# Patient Record
Sex: Male | Born: 1955 | Race: White | Hispanic: No | Marital: Married | Smoking: Never smoker
Health system: Southern US, Community
[De-identification: ages and names within clinical notes are randomized; demographics above are authoritative.]

## PROBLEM LIST (undated history)

## (undated) DIAGNOSIS — F32A Depression, unspecified: Secondary | ICD-10-CM

## (undated) DIAGNOSIS — M109 Gout, unspecified: Secondary | ICD-10-CM

## (undated) DIAGNOSIS — C801 Malignant (primary) neoplasm, unspecified: Secondary | ICD-10-CM

## (undated) DIAGNOSIS — I10 Essential (primary) hypertension: Secondary | ICD-10-CM

## (undated) DIAGNOSIS — F419 Anxiety disorder, unspecified: Secondary | ICD-10-CM

## (undated) DIAGNOSIS — F329 Major depressive disorder, single episode, unspecified: Secondary | ICD-10-CM

## (undated) HISTORY — DX: Gout, unspecified: M10.9

## (undated) HISTORY — DX: Depression, unspecified: F32.A

## (undated) HISTORY — DX: Anxiety disorder, unspecified: F41.9

## (undated) HISTORY — PX: INSERTION PROSTATE RADIATION SEED: SUR718

## (undated) HISTORY — DX: Essential (primary) hypertension: I10

## (undated) HISTORY — PX: COLONOSCOPY: SHX174

## (undated) HISTORY — DX: Major depressive disorder, single episode, unspecified: F32.9

---

## 2009-06-25 ENCOUNTER — Emergency Department (HOSPITAL_COMMUNITY): Admission: EM | Admit: 2009-06-25 | Discharge: 2009-06-25 | Payer: Self-pay | Admitting: Emergency Medicine
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
## 2016-08-01 ENCOUNTER — Encounter: Payer: Self-pay | Admitting: Urology

## 2016-08-01 ENCOUNTER — Ambulatory Visit (INDEPENDENT_AMBULATORY_CARE_PROVIDER_SITE_OTHER): Payer: BLUE CROSS/BLUE SHIELD | Admitting: Urology

## 2016-08-01 VITALS — BP 133/80 | HR 69 | Ht 69.5 in | Wt 194.5 lb

## 2016-08-01 DIAGNOSIS — N402 Nodular prostate without lower urinary tract symptoms: Secondary | ICD-10-CM | POA: Diagnosis not present

## 2016-08-01 DIAGNOSIS — N138 Other obstructive and reflux uropathy: Secondary | ICD-10-CM

## 2016-08-01 DIAGNOSIS — N401 Enlarged prostate with lower urinary tract symptoms: Secondary | ICD-10-CM | POA: Diagnosis not present

## 2016-08-01 DIAGNOSIS — N529 Male erectile dysfunction, unspecified: Secondary | ICD-10-CM

## 2016-08-01 NOTE — Progress Notes (Addendum)
08/01/2016 12:15 PM   Earl Pratt 1956/07/09 [id number]  Referring provider: Renee Rival, NP [address] [place], [place] [id number]  Chief Complaint  Patient presents with  . Erectile Dysfunction    New patient    HPI: Patient is a 61 year old Caucasian male who is referred by Renee Rival, NP for erectile dysfunction.  Erectile dysfunction His SHIM score is 5, which is severe ED.   He has been having difficulty with erections for the last five years.   His major complaint is lack of firmness for penetration.  His libido is preserved.  His risk factors for ED are age, BPH, HTN and blood pressure medications.   He denies any painful erections or curvatures with his erections.   He has tried Viagra in the past, but he did not like side effects.   He loss his wife in 11/2015.       SHIM    Row Name 08/01/16 1126         SHIM: Over the last 6 months:   How do you rate your confidence that you could get and keep an erection? Very Low     When you had erections with sexual stimulation, how often were your erections hard enough for penetration (entering your partner)? Almost Never or Never     During sexual intercourse, how often were you able to maintain your erection after you had penetrated (entered) your partner? Extremely Difficult     During sexual intercourse, how difficult was it to maintain your erection to completion of intercourse? Extremely Difficult     When you attempted sexual intercourse, how often was it satisfactory for you? Extremely Difficult       SHIM Total Score   SHIM 5        Score: 1-7 Severe ED 8-11 Moderate ED 12-16 Mild-Moderate ED 17-21 Mild ED 22-25 No ED   BPH WITH LUTS His IPSS score today is 7, which is mild lower urinary tract symptomatology. He is mostly satisfied with his quality life due to his urinary symptoms.   His major complaint today is nocturia x 2.  He has had these symptoms for several years.  He denies any  dysuria, hematuria or suprapubic pain.   He also denies any recent fevers, chills, nausea or vomiting.  He does not have a family history of PCa.      IPSS    Row Name 08/01/16 1100         International Prostate Symptom Score   How often have you had the sensation of not emptying your bladder? Less than 1 in 5     How often have you had to urinate less than every two hours? Less than half the time     How often have you found you stopped and started again several times when you urinated? Less than 1 in 5 times     How often have you found it difficult to postpone urination? Not at All     How often have you had a weak urinary stream? Less than 1 in 5 times     How often have you had to strain to start urination? Not at All     How many times did you typically get up at night to urinate? 2 Times     Total IPSS Score 7       Quality of Life due to urinary symptoms   If you were to spend  the rest of your life with your urinary condition just the way it is now how would you feel about that? Mostly Satisfied        Score:  1-7 Mild 8-19 Moderate 20-35 Severe    PMH: Past Medical History:  Diagnosis Date  . Anxiety   . Arthritis   . Depression   . Gout   . Hypertension     Surgical History: History reviewed. No pertinent surgical history.  Home Medications:  Allergies as of 08/01/2016   No Known Allergies     Medication List       Accurate as of 08/01/16 12:15 PM. Always use your most recent med list.          benazepril-hydrochlorthiazide 20-12.5 MG tablet Commonly known as:  LOTENSIN HCT       Allergies: No Known Allergies  Family History: Family History  Problem Relation Age of Onset  . Prostate cancer Neg Hx   . Bladder Cancer Neg Hx   . Kidney cancer Neg Hx     Social History:  reports that he has never smoked. He has never used smokeless tobacco. He reports that he drinks alcohol. He reports that he does not use  drugs.  ROS: UROLOGY Frequent Urination?: No Hard to postpone urination?: No Burning/pain with urination?: No Get up at night to urinate?: No Leakage of urine?: Yes Urine stream starts and stops?: No Trouble starting stream?: No Do you have to strain to urinate?: No Blood in urine?: No Urinary tract infection?: No Sexually transmitted disease?: No Injury to kidneys or bladder?: No Painful intercourse?: No Weak stream?: No Erection problems?: Yes Penile pain?: No  Gastrointestinal Nausea?: No Vomiting?: No Indigestion/heartburn?: No Diarrhea?: No Constipation?: No  Constitutional Fever: No Night sweats?: No Weight loss?: No Fatigue?: No  Skin Skin rash/lesions?: No Itching?: No  Eyes Blurred vision?: No Double vision?: No  Ears/Nose/Throat Sore throat?: No Sinus problems?: No  Hematologic/Lymphatic Swollen glands?: No Easy bruising?: No  Cardiovascular Leg swelling?: No Chest pain?: No  Respiratory Cough?: No Shortness of breath?: No  Endocrine Excessive thirst?: No  Musculoskeletal Back pain?: No Joint pain?: No  Neurological Headaches?: No Dizziness?: No  Psychologic Depression?: Yes Anxiety?: Yes  Physical Exam: BP 133/80 (BP Location: Left Arm, Patient Position: Sitting, Cuff Size: Normal)   Pulse 69   Ht 5' 9.5" (1.765 m)   Wt 194 lb 8 oz (88.2 kg)   BMI 28.31 kg/m   Constitutional: Well nourished. Alert and oriented, No acute distress. HEENT: [place] AT, moist mucus membranes. Trachea midline, no masses. Cardiovascular: No clubbing, cyanosis, or edema. Respiratory: Normal respiratory effort, no increased work of breathing. GI: Abdomen is soft, non tender, non distended, no abdominal masses. Liver and spleen not palpable.  No hernias appreciated.  Stool sample for occult testing is not indicated.   GU: No CVA tenderness.  No bladder fullness or masses.  Patient with circumcised phallus.  Urethral meatus is patent.  No penile  discharge. No penile lesions or rashes. Scrotum without lesions, cysts, rashes and/or edema.  Testicles are located scrotally bilaterally. No masses are appreciated in the testicles. Left and right epididymis are normal. Rectal: Patient with  normal sphincter tone. Anus and perineum without scarring or rashes. No rectal masses are appreciated. Prostate is approximately 55 grams, 10 mm x 10 mm nodules is appreciated in the mid left lobe.  Seminal vesicles are normal. Skin: No rashes, bruises or suspicious lesions. Lymph: No cervical or inguinal adenopathy. Neurologic:  Grossly intact, no focal deficits, moving all 4 extremities. Psychiatric: Normal mood and affect.  Laboratory Data: PSA History  4.0 ng/mL on 06/08/2016   % Free PSA 12.3   0.49 PSA, Free   Assessment & Plan:    1. Erectile dysfunction  - SHIM score is 5  - I explained to the patient that in order to achieve an erection it takes good functioning of the nervous system (parasympathetic, sympathetic, sensory and motor), good blood flow into the erectile tissue of the penis and a desire to have sex  - I explained that conditions like diabetes, hypertension, coronary artery disease, peripheral vascular disease, smoking, alcohol consumption, age, sleep apnea and BPH can diminish the ability to have an erection  - We discussed trying a different PDE5 inhibitor, intra-urethral suppositories, intracavernous vasoactive drug injection therapy, vacuum constriction device and penile prosthesis implantation  - He would like to try Stendra  - RTC pending prostate biopsy results  2. BPH with LUTS  - IPSS score is 7/2  - Continue conservative management, avoiding bladder irritants and timed voiding's  - RTC pending prostate biopsy results  3. Prostate nodule  - discussed with the patient that his most recent free and total PSA noted a 24% probability of having prostate cancer  - repeated PSA today  - Patient will be schedule for a TRUSPBx  of prostate.  The procedure is explained and the risks involved, such as blood in urine, blood in stool, blood in semen, infection, urinary retention, and on rare occasions sepsis and death.  Patient understands the risks as explained to him and he wishes to proceed.     Return for scheule prostate biospy.  These notes generated with voice recognition software. I apologize for typographical errors.  Royden Purl  [facility] [address], [place], [place] [address] [phone]   Addendum:  Patient has provided previous PSA.  2.8 ng/mL on 07/18/2015  0.38 ng/mL PSA, Free  13.6% Free PSA

## 2016-08-02 ENCOUNTER — Telehealth: Payer: Self-pay

## 2016-08-02 LAB — PSA: PROSTATE SPECIFIC AG, SERUM: 4.5 ng/mL — AB (ref 0.0–4.0)

## 2016-08-02 NOTE — Telephone Encounter (Signed)
LMOM

## 2016-08-02 NOTE — Telephone Encounter (Signed)
-----   Message from Nori Riis, PA-C sent at 08/02/2016  7:56 AM EST ----- Patient's PSA is unchanged.  Proceed with biopsy.

## 2016-08-03 NOTE — Telephone Encounter (Signed)
Spoke with pt in reference to PSA results and prostate bx. Pt voiced understanding stating he already has his prep info and appt.

## 2016-08-18 ENCOUNTER — Encounter: Payer: Self-pay | Admitting: Urology

## 2016-08-18 ENCOUNTER — Other Ambulatory Visit: Payer: Self-pay | Admitting: Urology

## 2016-08-18 ENCOUNTER — Ambulatory Visit: Payer: BLUE CROSS/BLUE SHIELD | Admitting: Urology

## 2016-08-18 VITALS — BP 163/92 | HR 67 | Ht 69.0 in | Wt 191.2 lb

## 2016-08-18 DIAGNOSIS — R972 Elevated prostate specific antigen [PSA]: Secondary | ICD-10-CM

## 2016-08-18 DIAGNOSIS — N402 Nodular prostate without lower urinary tract symptoms: Secondary | ICD-10-CM

## 2016-08-18 MED ORDER — LEVOFLOXACIN 500 MG PO TABS
500.0000 mg | ORAL_TABLET | Freq: Once | ORAL | Status: AC
  Administered 2016-08-18: 500 mg via ORAL

## 2016-08-18 MED ORDER — GENTAMICIN SULFATE 40 MG/ML IJ SOLN
80.0000 mg | Freq: Once | INTRAMUSCULAR | Status: AC
  Administered 2016-08-18: 80 mg via INTRAMUSCULAR

## 2016-08-18 NOTE — Progress Notes (Signed)
Prostate Biopsy Procedure   HPI: 61 y.o. male with PSA of 4.0 and prostate nodule noted on exam (1 cm nodule in mid left lobe).   Informed consent was obtained after discussing risks/benefits of the procedure.  A time out was performed to ensure correct patient identity.  Pre-Procedure: - Last PSA Level: 4.0 - Gentamicin given prophylactically - Levaquin 500 mg administered PO -Transrectal Ultrasound performed revealing a 49.68 gm prostate -No significant hypoechoic or median lobe noted  Procedure: - Prostate block performed using 10 cc 1% lidocaine and biopsies taken from sextant areas, a total of 12 under ultrasound guidance.  Post-Procedure: - Patient tolerated the procedure well - He was counseled to seek immediate medical attention if experiences any severe pain, significant bleeding, or fevers - Return in one week to discuss biopsy results

## 2016-08-24 ENCOUNTER — Other Ambulatory Visit: Payer: Self-pay

## 2016-08-25 ENCOUNTER — Ambulatory Visit (INDEPENDENT_AMBULATORY_CARE_PROVIDER_SITE_OTHER): Payer: BLUE CROSS/BLUE SHIELD | Admitting: Urology

## 2016-08-25 ENCOUNTER — Encounter: Payer: Self-pay | Admitting: Urology

## 2016-08-25 ENCOUNTER — Ambulatory Visit: Payer: BLUE CROSS/BLUE SHIELD | Admitting: Urology

## 2016-08-25 ENCOUNTER — Other Ambulatory Visit: Payer: Self-pay | Admitting: Urology

## 2016-08-25 VITALS — BP 164/78 | HR 69 | Ht 69.0 in | Wt 192.0 lb

## 2016-08-25 DIAGNOSIS — C61 Malignant neoplasm of prostate: Secondary | ICD-10-CM | POA: Diagnosis not present

## 2016-08-25 DIAGNOSIS — N5201 Erectile dysfunction due to arterial insufficiency: Secondary | ICD-10-CM | POA: Diagnosis not present

## 2016-08-25 LAB — PATHOLOGY REPORT

## 2016-08-25 NOTE — Progress Notes (Signed)
08/25/2016 3:39 PM   Earl Pratt [birth year] [id number]  Referring provider: Renee Rival, NP [address]O. [place], [place] [address]  No chief complaint on file.   HPI: The patient is a 61 year old gentleman presents today for prostate biopsy results.  1. Prostate cancer The patient had 4 out of 12 cores positive for prostate cancer. 2 of these cores were Gleason 4+3 = 7 prostate cancer at the left base ranging from 75-80%. He had 1 core at the left lateral mid of 1% Gleason 3+4 = 7 prostate cancer. He had 1 core at the right medial mid of Gleason 3+3 = 6 prostate cancer.  PSA diagnosis: 4.5.   DRE: Prostate nodules noted on left lateral lobe  2. Erectile dysfunction Shim: 5 (severe) He has tried Viagra and [place] which were ineffective.   PMH: Past Medical History:  Diagnosis Date  . Anxiety   . Arthritis   . Depression   . Gout   . Hypertension     Surgical History: No past surgical history on file.  Home Medications:  Allergies as of 08/25/2016   No Known Allergies     Medication List       Accurate as of 08/25/16  3:39 PM. Always use your most recent med list.          benazepril-hydrochlorthiazide 20-12.5 MG tablet Commonly known as:  LOTENSIN HCT       Allergies: No Known Allergies  Family History: Family History  Problem Relation Age of Onset  . Prostate cancer Neg Hx   . Bladder Cancer Neg Hx   . Kidney cancer Neg Hx     Social History:  reports that he has never smoked. He has never used smokeless tobacco. He reports that he drinks alcohol. He reports that he does not use drugs.  ROS: UROLOGY Frequent Urination?: No Hard to postpone urination?: No Burning/pain with urination?: No Get up at night to urinate?: Yes Leakage of urine?: No Urine stream starts and stops?: No Trouble starting stream?: No Do you have to strain to urinate?: No Blood in urine?: Yes Urinary tract infection?: No Sexually transmitted disease?:  No Injury to kidneys or bladder?: No Painful intercourse?: No Weak stream?: No Erection problems?: Yes Penile pain?: No  Gastrointestinal Nausea?: No Vomiting?: No Indigestion/heartburn?: No Diarrhea?: No Constipation?: No  Constitutional Fever: No Night sweats?: No Weight loss?: No Fatigue?: No  Skin Skin rash/lesions?: No Itching?: No  Eyes Blurred vision?: No Double vision?: No  Ears/Nose/Throat Sore throat?: No Sinus problems?: No  Hematologic/Lymphatic Swollen glands?: No Easy bruising?: No  Cardiovascular Leg swelling?: No Chest pain?: No  Respiratory Cough?: No Shortness of breath?: No  Endocrine Excessive thirst?: No  Musculoskeletal Back pain?: No Joint pain?: No  Neurological Headaches?: No Dizziness?: No  Psychologic Depression?: No Anxiety?: No  Physical Exam: BP (!) 164/78   Pulse 69   Ht 5\' 9"  (1.753 m)   Wt 192 lb (87.1 kg)   BMI 28.35 kg/m   Constitutional:  Alert and oriented, No acute distress. HEENT: [place] AT, moist mucus membranes.  Trachea midline, no masses. Cardiovascular: No clubbing, cyanosis, or edema. Respiratory: Normal respiratory effort, no increased work of breathing. GI: Abdomen is soft, nontender, nondistended, no abdominal masses GU: No CVA tenderness.  Skin: No rashes, bruises or suspicious lesions. Lymph: No cervical or inguinal adenopathy. Neurologic: Grossly intact, no focal deficits, moving all 4 extremities. Psychiatric: Normal mood and affect.  Laboratory Data: No results found for: WBC,  HGB, HCT, MCV, PLT  No results found for: CREATININE  No results found for: PSA  No results found for: TESTOSTERONE  No results found for: HGBA1C  Urinalysis No results found for: COLORURINE, APPEARANCEUR, LABSPEC, PHURINE, GLUCOSEU, HGBUR, BILIRUBINUR, KETONESUR, PROTEINUR, UROBILINOGEN, NITRITE, LEUKOCYTESUR    Assessment & Plan:    1. 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 We discussed the natural history of prostate cancer as well as treatment possibilities for him. We described all treatment all current rhythm is for prostate cancer including watchful waiting, active surveillance, robotic prostatectomy, radiation therapy, cryotherapy. Due to his intermediate risk disease, we focused on robotic prostatectomy and radiation therapy. We discussed risks, benefits, indications of a robotic prostatectomy with pelvic lymph node dissection. He understands the risks include bleeding, infection, iatrogenic injury, prolonged hospitalization among others. He also understands that surgery will undoubtedly make his erectile dysfunction which is already severe even worse. He also understands the risks for incontinence. He understands there is also a possibility for positive margins requiring adjuvant therapy. We also discussed radiation therapy and different modalities for this as well as some of the side effects and risks. I would like for him to see our radiation oncologist, Dr. Baruch Gouty, to further discuss this. I will see him back for further discussion approximate 1 month after he has a chance to research this disease, talk to his front of prostate cancer, and see radiation oncology. He was given the 100 questions of prostate cancer book.  2. Erectile dysfunction We'll need to further address after treating above. He will likely need in the future either intracavernosal injections versus penile prosthesis.   No Follow-up on file.  Nickie Retort, MD  [facility] [address] [place], [place] [address] [phone]

## 2016-09-06 ENCOUNTER — Ambulatory Visit: Payer: BLUE CROSS/BLUE SHIELD

## 2016-09-06 ENCOUNTER — Ambulatory Visit: Payer: BLUE CROSS/BLUE SHIELD | Admitting: Radiation Oncology

## 2016-09-23 ENCOUNTER — Ambulatory Visit: Payer: BLUE CROSS/BLUE SHIELD

## 2018-03-22 ENCOUNTER — Other Ambulatory Visit: Payer: Self-pay

## 2018-07-19 ENCOUNTER — Encounter: Payer: Self-pay | Admitting: *Deleted

## 2018-07-20 ENCOUNTER — Ambulatory Visit
Admission: RE | Admit: 2018-07-20 | Discharge: 2018-07-20 | Disposition: A | Discharge status: Home / Self Care | Payer: BLUE CROSS/BLUE SHIELD | Attending: Unknown Physician Specialty | Admitting: Unknown Physician Specialty

## 2018-07-20 ENCOUNTER — Encounter
Admission: RE | Disposition: A | Discharge status: Home / Self Care | Payer: Self-pay | Source: Home / Self Care | Attending: Unknown Physician Specialty

## 2018-07-20 ENCOUNTER — Ambulatory Visit: Payer: BLUE CROSS/BLUE SHIELD | Admitting: Anesthesiology

## 2018-07-20 ENCOUNTER — Encounter: Payer: Self-pay | Admitting: *Deleted

## 2018-07-20 ENCOUNTER — Other Ambulatory Visit: Payer: Self-pay

## 2018-07-20 DIAGNOSIS — Z79899 Other long term (current) drug therapy: Secondary | ICD-10-CM | POA: Diagnosis not present

## 2018-07-20 DIAGNOSIS — K5520 Angiodysplasia of colon without hemorrhage: Secondary | ICD-10-CM | POA: Diagnosis present

## 2018-07-20 DIAGNOSIS — M109 Gout, unspecified: Secondary | ICD-10-CM | POA: Insufficient documentation

## 2018-07-20 DIAGNOSIS — K625 Hemorrhage of anus and rectum: Secondary | ICD-10-CM | POA: Diagnosis not present

## 2018-07-20 DIAGNOSIS — F329 Major depressive disorder, single episode, unspecified: Secondary | ICD-10-CM | POA: Diagnosis not present

## 2018-07-20 DIAGNOSIS — I10 Essential (primary) hypertension: Secondary | ICD-10-CM | POA: Diagnosis not present

## 2018-07-20 DIAGNOSIS — F419 Anxiety disorder, unspecified: Secondary | ICD-10-CM | POA: Diagnosis not present

## 2018-07-20 DIAGNOSIS — M1990 Unspecified osteoarthritis, unspecified site: Secondary | ICD-10-CM | POA: Diagnosis not present

## 2018-07-20 HISTORY — DX: Malignant (primary) neoplasm, unspecified: C80.1

## 2018-07-20 HISTORY — PX: COLONOSCOPY WITH PROPOFOL: SHX5780

## 2018-07-20 SURGERY — COLONOSCOPY WITH PROPOFOL
Anesthesia: General

## 2018-07-20 MED ORDER — FENTANYL CITRATE (PF) 100 MCG/2ML IJ SOLN
INTRAMUSCULAR | Status: DC | PRN
  Administered 2018-07-20: 50 ug via INTRAVENOUS

## 2018-07-20 MED ORDER — PROPOFOL 500 MG/50ML IV EMUL
INTRAVENOUS | Status: AC
  Filled 2018-07-20: qty 50

## 2018-07-20 MED ORDER — EPHEDRINE SULFATE 50 MG/ML IJ SOLN
INTRAMUSCULAR | Status: DC | PRN
  Administered 2018-07-20: 10 mg via INTRAVENOUS

## 2018-07-20 MED ORDER — SODIUM CHLORIDE 0.9 % IV SOLN: INTRAVENOUS | Status: DC

## 2018-07-20 MED ORDER — FENTANYL CITRATE (PF) 100 MCG/2ML IJ SOLN
INTRAMUSCULAR | Status: AC
  Filled 2018-07-20: qty 2

## 2018-07-20 MED ORDER — LIDOCAINE HCL (PF) 2 % IJ SOLN
INTRAMUSCULAR | Status: AC
  Filled 2018-07-20: qty 10

## 2018-07-20 MED ORDER — MIDAZOLAM HCL 2 MG/2ML IJ SOLN
INTRAMUSCULAR | Status: DC | PRN
  Administered 2018-07-20: 1 mg via INTRAVENOUS

## 2018-07-20 MED ORDER — SODIUM CHLORIDE 0.9 % IV SOLN
INTRAVENOUS | Status: DC
  Administered 2018-07-20: 10:00:00 via INTRAVENOUS

## 2018-07-20 MED ORDER — PROPOFOL 10 MG/ML IV BOLUS
INTRAVENOUS | Status: DC | PRN
  Administered 2018-07-20: 40 mg via INTRAVENOUS

## 2018-07-20 MED ORDER — MIDAZOLAM HCL 2 MG/2ML IJ SOLN
INTRAMUSCULAR | Status: AC
  Filled 2018-07-20: qty 2

## 2018-07-20 MED ORDER — PROPOFOL 500 MG/50ML IV EMUL
INTRAVENOUS | Status: DC | PRN
  Administered 2018-07-20: 150 ug/kg/min via INTRAVENOUS

## 2018-07-20 MED ORDER — EPHEDRINE SULFATE 50 MG/ML IJ SOLN
INTRAMUSCULAR | Status: AC
  Filled 2018-07-20: qty 1

## 2018-07-20 NOTE — Anesthesia Preprocedure Evaluation (Signed)
Anesthesia Evaluation  Patient identified by MRN, date of birth, ID band Patient awake    Reviewed: Allergy & Precautions, NPO status , Patient's Chart, lab work & pertinent test results  History of Anesthesia Complications Negative for: history of anesthetic complications  Airway Mallampati: II       Dental   Pulmonary neg sleep apnea, neg COPD,           Cardiovascular hypertension, Pt. on medications (-) Past MI and (-) CHF (-) dysrhythmias (-) Valvular Problems/Murmurs     Neuro/Psych neg Seizures Anxiety Depression    GI/Hepatic Neg liver ROS, neg GERD  ,  Endo/Other  neg diabetes  Renal/GU negative Renal ROS     Musculoskeletal   Abdominal   Peds  Hematology   Anesthesia Other Findings   Reproductive/Obstetrics                             Anesthesia Physical Anesthesia Plan  ASA: II  Anesthesia Plan: General   Post-op Pain Management:    Induction: Intravenous  PONV Risk Score and Plan: 2 and TIVA and Propofol infusion  Airway Management Planned: Nasal Cannula  Additional Equipment:   Intra-op Plan:   Post-operative Plan:   Informed Consent: I have reviewed the patients History and Physical, chart, labs and discussed the procedure including the risks, benefits and alternatives for the proposed anesthesia with the patient or authorized representative who has indicated his/her understanding and acceptance.     Plan Discussed with:   Anesthesia Plan Comments:         Anesthesia Quick Evaluation

## 2018-07-20 NOTE — H&P (Signed)
   Primary Care Physician:  Renee Rival, NP Primary Gastroenterologist:  Dr. Vira Agar  Pre-Procedure History & Physical: HPI:  Earl Pratt is a 63 y.o. male is here for an colonoscopy.  He has rectal bleeding which is likely due to radiation treatment to a prostate.   Past Medical History:  Diagnosis Date  . Anxiety   . Arthritis   . Depression   . Gout   . Hypertension     History reviewed. No pertinent surgical history.  Prior to Admission medications   Medication Sig Start Date End Date Taking? Authorizing Provider  TURMERIC PO Take by mouth.   Yes [provider]  benazepril-hydrochlorthiazide (LOTENSIN HCT) 20-12.5 MG tablet  07/16/16   [provider]    Allergies as of 07/19/2018  . (No Known Allergies)    Family History  Problem Relation Age of Onset  . Prostate cancer Neg Hx   . Bladder Cancer Neg Hx   . Kidney cancer Neg Hx     Social History   Socioeconomic History  . Marital status: Married    Spouse name: Not on file  . Number of children: Not on file  . Years of education: Not on file  . Highest education level: Not on file  Occupational History  . Not on file  Social Needs  . Financial resource strain: Not on file  . Food insecurity:    Worry: Not on file    Inability: Not on file  . Transportation needs:    Medical: Not on file    Non-medical: Not on file  Tobacco Use  . Smoking status: Never Smoker  . Smokeless tobacco: Never Used  Substance and Sexual Activity  . Alcohol use: Yes  . Drug use: No  . Sexual activity: Not on file  Lifestyle  . Physical activity:    Days per week: Not on file    Minutes per session: Not on file  . Stress: Not on file  Relationships  . Social connections:    Talks on phone: Not on file    Gets together: Not on file    Attends religious service: Not on file    Active member of club or organization: Not on file    Attends meetings of clubs or organizations: Not on file   Relationship status: Not on file  . Intimate partner violence:    Fear of current or ex partner: Not on file    Emotionally abused: Not on file    Physically abused: Not on file    Forced sexual activity: Not on file  Other Topics Concern  . Not on file  Social History Narrative  . Not on file    Review of Systems: See HPI, otherwise negative ROS  Physical Exam: There were no vitals taken for this visit. General:   Alert,  pleasant and cooperative in NAD Head:  Normocephalic and atraumatic. Neck:  Supple; no masses or thyromegaly. Lungs:  Clear throughout to auscultation.    Heart:  Regular rate and rhythm. Abdomen:  Soft, nontender and nondistended. Normal bowel sounds, without guarding, and without rebound.   Neurologic:  Alert and  oriented x4;  grossly normal neurologically.  Impression/Plan: Earl Pratt is here for an colonoscopy to be performed for rectal bleeding.  Risks, benefits, limitations, and alternatives regarding  colonoscopy have been reviewed with the patient.  Questions have been answered.  All parties agreeable.   Gaylyn Cheers, MD  07/20/2018, 8:36 AM

## 2018-07-20 NOTE — Op Note (Signed)
[facility] Gastroenterology Patient Name: Earl Pratt Procedure Date: 07/20/2018 9:39 AM MRN: [id number] Account #: [id number] Date of Birth: 02-03-1956 Admit Type: Outpatient Age: 62 Room: [facility] ENDO ROOM 1 Gender: Male Note Status: Finalized Procedure:            Colonoscopy Indications:          Rectal bleeding Providers:            Manya Silvas, MD Referring MD:         Renee Rival (Referring MD) Medicines:            Propofol per Anesthesia Complications:        No immediate complications. Procedure:            Pre-Anesthesia Assessment:                       - After reviewing the risks and benefits, the patient                        was deemed in satisfactory condition to undergo the                        procedure.                       After obtaining informed consent, the colonoscope was                        passed under direct vision. Throughout the procedure,                        the patient's blood pressure, pulse, and oxygen                        saturations were monitored continuously. The                        Colonoscope was introduced through the anus and                        advanced to the the cecum, identified by appendiceal                        orifice and ileocecal valve. The colonoscopy was                        performed without difficulty. The patient tolerated the                        procedure well. The quality of the bowel preparation                        was excellent. Findings:      Complete exam of colon showed no abnormality except radiation mucosal       changes in the rectum.      Multiple small localized angiodysplastic lesions without bleeding were       found localized in one area in distal rectum.. Coagulation for       hemostasis using argon plasma at 0.5 liters/minute and 20 watts was       successful.      The exam  was otherwise without abnormality. Impression:           - Multiple  non-bleeding colonic angiodysplastic                        lesions. Treated with argon plasma coagulation (APC).                       - The examination was otherwise normal.                       - No specimens collected. Recommendation:       - The findings and recommendations were discussed with                        the patient. Take stool softeners with laxative every                        day at least 4 per day. mBest taken in the evening                        before bed time. Take tramadol with Tylenol if needed Manya Silvas, MD 07/20/2018 10:26:52 AM This report has been signed electronically. Number of Addenda: 0 Note Initiated On: 07/20/2018 9:39 AM Scope Withdrawal Time: 0 hours 16 minutes 31 seconds  Total Procedure Duration: 0 hours 23 minutes 46 seconds       [facility]

## 2018-07-20 NOTE — Anesthesia Postprocedure Evaluation (Signed)
Anesthesia Post Note  Patient: Earl Pratt  Procedure(s) Performed: COLONOSCOPY WITH PROPOFOL (N/A )  Patient location during evaluation: Endoscopy Anesthesia Type: General Level of consciousness: awake and alert Pain management: pain level controlled Vital Signs Assessment: post-procedure vital signs reviewed and stable Respiratory status: spontaneous breathing and respiratory function stable Cardiovascular status: stable Anesthetic complications: no     Last Vitals:  Vitals:   07/20/18 1040 07/20/18 1050  BP: (!) 141/75 (!) 159/70  Pulse: 66 61  Resp: 17 11  Temp:    SpO2: 98% 100%    Last Pain:  Vitals:   07/20/18 1020  TempSrc: Tympanic  PainSc:                  [name]

## 2018-07-20 NOTE — Anesthesia Post-op Follow-up Note (Signed)
Anesthesia QCDR form completed.        

## 2018-07-20 NOTE — Anesthesia Procedure Notes (Signed)
Date/Time: 07/20/2018 9:45 AM Performed by: Allean Found, CRNA Pre-anesthesia Checklist: Patient identified, Emergency Drugs available, Suction available, Patient being monitored and Timeout performed Patient Re-evaluated:Patient Re-evaluated prior to induction Oxygen Delivery Method: Nasal cannula Placement Confirmation: positive ETCO2

## 2018-07-20 NOTE — Transfer of Care (Signed)
Immediate Anesthesia Transfer of Care Note  Patient: Earl Pratt  Procedure(s) Performed: COLONOSCOPY WITH PROPOFOL (N/A )  Patient Location: PACU  Anesthesia Type:General  Level of Consciousness: awake and oriented  Airway & Oxygen Therapy: Patient Spontanous Breathing and Patient connected to nasal cannula oxygen  Post-op Assessment: Report given to RN and Post -op Vital signs reviewed and stable  Post vital signs: Reviewed and stable  Last Vitals:  Vitals Value Taken Time  BP 117/68 07/20/2018 10:23 AM  Temp 36.1 C 07/20/2018 10:20 AM  Pulse 70 07/20/2018 10:20 AM  Resp 12 07/20/2018 10:25 AM  SpO2    Vitals shown include unvalidated device data.  Last Pain:  Vitals:   07/20/18 1020  TempSrc: Tympanic  PainSc:          Complications: No apparent anesthesia complications

## 2022-11-18 LAB — LAB REPORT - SCANNED: EGFR: 82

## 2023-02-28 ENCOUNTER — Ambulatory Visit: Payer: Medicare HMO | Attending: Internal Medicine | Admitting: Cardiology

## 2023-02-28 ENCOUNTER — Encounter: Payer: Self-pay | Admitting: Cardiology

## 2023-02-28 VITALS — BP 142/80 | HR 55 | Ht 70.0 in | Wt 189.0 lb

## 2023-02-28 DIAGNOSIS — R42 Dizziness and giddiness: Secondary | ICD-10-CM | POA: Diagnosis not present

## 2023-02-28 DIAGNOSIS — R030 Elevated blood-pressure reading, without diagnosis of hypertension: Secondary | ICD-10-CM | POA: Diagnosis not present

## 2023-02-28 NOTE — Progress Notes (Signed)
Clinical Summary Mr. Scull is a 67 y.o.male seen today as a new consult, referred by NP Strader for the following medical problems.   1.Dizziness/labile bp's - home bp's variable.  110s/50s to 60s primarily, did have some SBPs in the 80s about 2 weeks ago with associate dizziness. Home bp has been validated by pcp -dizziness 2 weeks ago with standing up. Would resolve sitting down or laying down.  -works part time where he works, no AC can be very hot up to 8 hours. - drinks 3 bottles of water daily. Orange juice in AM, 3-4 cups of tea.  - had some diarrhea that week as well - intermittent low bp's in the past also - stopped his benazepril/hydrochlorothiazide for several months  Past Medical History:  Diagnosis Date   Anxiety    Cancer (HCC)    prostate   Depression    Gout    Hypertension      No Known Allergies   Current Outpatient Medications  Medication Sig Dispense Refill   benazepril-hydrochlorthiazide (LOTENSIN HCT) 20-12.5 MG tablet      Omega-3-acid Ethyl Esters (LOVAZA PO) Take by mouth.     TURMERIC PO Take by mouth.     No current facility-administered medications for this visit.     Past Surgical History:  Procedure Laterality Date   COLONOSCOPY     COLONOSCOPY WITH PROPOFOL N/A 07/20/2018   Procedure: COLONOSCOPY WITH PROPOFOL;  Surgeon: Scot Jun, MD;  Location: [facility] ENDOSCOPY;  Service: Endoscopy;  Laterality: N/A;   INSERTION PROSTATE RADIATION SEED       No Known Allergies    Family History  Problem Relation Age of Onset   Prostate cancer Neg Hx    Bladder Cancer Neg Hx    Kidney cancer Neg Hx      Social History Mr. Duree reports that he has never smoked. He has never used smokeless tobacco. Mr. Maines reports current alcohol use of about 2.0 standard drinks of alcohol per week.   Review of Systems CONSTITUTIONAL: No weight loss, fever, chills, weakness or fatigue.  HEENT: Eyes: No visual loss, blurred vision, double  vision or yellow sclerae.No hearing loss, sneezing, congestion, runny nose or sore throat.  SKIN: No rash or itching.  CARDIOVASCULAR: per hpi RESPIRATORY: No shortness of breath, cough or sputum.  GASTROINTESTINAL: No anorexia, nausea, vomiting or diarrhea. No abdominal pain or blood.  GENITOURINARY: No burning on urination, no polyuria NEUROLOGICAL: No headache, dizziness, syncope, paralysis, ataxia, numbness or tingling in the extremities. No change in bowel or bladder control.  MUSCULOSKELETAL: No muscle, back pain, joint pain or stiffness.  LYMPHATICS: No enlarged nodes. No history of splenectomy.  PSYCHIATRIC: No history of depression or anxiety.  ENDOCRINOLOGIC: No reports of sweating, cold or heat intolerance. No polyuria or polydipsia.  Marland Kitchen   Physical Examination Today's Vitals   02/28/23 1039  BP: (!) 142/80  Pulse: (!) 55  SpO2: 98%  Weight: 189 lb (85.7 kg)  Height: 5\' 10"  (1.778 m)   Body mass index is 27.12 kg/m.  Gen: resting comfortably, no acute distress HEENT: no scleral icterus, pupils equal round and reactive, no palptable cervical adenopathy,  CV: RRR, no m/rg, no jvd Resp: Clear to auscultation bilaterally GI: abdomen is soft, non-tender, non-distended, normal bowel sounds, no hepatosplenomegaly MSK: extremities are warm, no edema.  Skin: warm, no rash Neuro:  no focal deficits Psych: appropriate affect     Assessment and Plan  1.White coat HTN -  home bp's at goal, elevated in clinic. Pcp had previously validated his home cuff - intermittent low bp's I suspect due to dehydration. Limited oral hydration, works in very hot building without [facility] for up to 8 hours. Had some diarrhea around the most recent low bp's - encouraged increased oral hydration, at least 2-3 L of water per day, could add electrolytes on hot days at work. I suspect if maintains adequate hydration his orthostatic dizziness and intermittent low bp's will resolve  EKG shows NSR   F/u 6  months, if bp issues resolved could see just as needed.       Antoine Poche, M.D.,

## 2023-02-28 NOTE — Patient Instructions (Signed)
Medication Instructions:  Continue all current medications.   Labwork: none  Testing/Procedures: none  Follow-Up: 6 months   Any Other Special Instructions Will Be Listed Below (If Applicable).   If you need a refill on your cardiac medications before your next appointment, please call your pharmacy.  

## 2023-03-14 ENCOUNTER — Ambulatory Visit: Payer: BLUE CROSS/BLUE SHIELD | Admitting: Internal Medicine

## 2023-09-04 ENCOUNTER — Ambulatory Visit: Payer: Medicare HMO | Admitting: Cardiology
